# Patient Record
Sex: Male | Born: 1990 | Race: White | Hispanic: Yes | Marital: Single | Smoking: Never smoker
Health system: Southern US, Community
[De-identification: ages and names within clinical notes are randomized; demographics above are authoritative.]

## PROBLEM LIST (undated history)

## (undated) DIAGNOSIS — I10 Essential (primary) hypertension: Secondary | ICD-10-CM

## (undated) DIAGNOSIS — R569 Unspecified convulsions: Secondary | ICD-10-CM

---

## 2016-09-25 ENCOUNTER — Encounter (HOSPITAL_COMMUNITY): Payer: Self-pay | Admitting: Emergency Medicine

## 2016-09-25 ENCOUNTER — Emergency Department (HOSPITAL_COMMUNITY): Payer: Self-pay

## 2016-09-25 ENCOUNTER — Emergency Department (HOSPITAL_COMMUNITY)
Admission: EM | Admit: 2016-09-25 | Discharge: 2016-09-26 | Disposition: A | Discharge status: Home / Self Care | Payer: Self-pay | Attending: Emergency Medicine | Admitting: Emergency Medicine

## 2016-09-25 DIAGNOSIS — Y939 Activity, unspecified: Secondary | ICD-10-CM | POA: Insufficient documentation

## 2016-09-25 DIAGNOSIS — W228XXA Striking against or struck by other objects, initial encounter: Secondary | ICD-10-CM | POA: Insufficient documentation

## 2016-09-25 DIAGNOSIS — Z5181 Encounter for therapeutic drug level monitoring: Secondary | ICD-10-CM | POA: Insufficient documentation

## 2016-09-25 DIAGNOSIS — Y92009 Unspecified place in unspecified non-institutional (private) residence as the place of occurrence of the external cause: Secondary | ICD-10-CM | POA: Insufficient documentation

## 2016-09-25 DIAGNOSIS — Y999 Unspecified external cause status: Secondary | ICD-10-CM | POA: Insufficient documentation

## 2016-09-25 DIAGNOSIS — S0990XA Unspecified injury of head, initial encounter: Secondary | ICD-10-CM | POA: Insufficient documentation

## 2016-09-25 DIAGNOSIS — R55 Syncope and collapse: Secondary | ICD-10-CM | POA: Insufficient documentation

## 2016-09-25 LAB — CBG MONITORING, ED: GLUCOSE-CAPILLARY: 141 mg/dL — AB (ref 65–99)

## 2016-09-25 MED ORDER — SODIUM CHLORIDE 0.9 % IV BOLUS (SEPSIS)
1000.0000 mL | Freq: Once | INTRAVENOUS | Status: AC
  Administered 2016-09-25: 1000 mL via INTRAVENOUS

## 2016-09-25 MED ORDER — ONDANSETRON HCL 4 MG/2ML IJ SOLN
4.0000 mg | Freq: Once | INTRAMUSCULAR | Status: AC
  Administered 2016-09-25: 4 mg via INTRAVENOUS
  Filled 2016-09-25: qty 2

## 2016-09-25 NOTE — ED Triage Notes (Signed)
Pt arrives via EMS after two falls at home, witnessed by family, unsure if seizure activity occurred but family described posturing episode. Hx HTN. Pt nauseated at this time. Given 4MG  zofran by EMS. Pt had hematoma to posterior head and abrasion to anterior head near hairline.   CBG 100, hypertensive in the field.

## 2016-09-25 NOTE — ED Notes (Signed)
Prompted to provide urine specimen 

## 2016-09-25 NOTE — ED Provider Notes (Signed)
[facility]-EMERGENCY DEPT Provider Note   CSN: [id number] Arrival date & time: 09/25/16  2251  By signing my name below, I, Christy SartoriusAnastasia Kolousek, attest that this documentation has been prepared under the direction and in the presence of Linwood DibblesJon Tonea Leiphart, MD . Electronically Signed: Christy SartoriusAnastasia Kolousek, Scribe. 09/25/2016. 12:25 AM.  History   Chief Complaint Chief Complaint  Patient presents with  . Fall   The history is provided by the patient and medical records. No language interpreter was used.    HPI Comments:  Tyler Small is a 25 y.o. male brought in by ambulance, who presents to the Emergency Department s/p syncope just PTA complaining of pain in his head, upper back, neck and upper body.   Pt states that he blacked out a halfway house and fell hitting his neck and back onto the ground.  Pt does not remember the episode; he was put in a bed at the halfway house and the first thing he remembers is waking up in the bed hearing the voices of people in the room.  Pt denies history of syncope and feeling sick today.  Pt states he has no known medical problems and takes no regular medications, but used to take medication for HTN.  He denies  known allergies, smoking and FMHx of medical problems.  He also denies vomiting, diarrhea, fever and blood in stool.     History reviewed. No pertinent past medical history.  There are no active problems to display for this patient.   No past surgical history on file.     Home Medications    Prior to Admission medications   Medication Sig Start Date End Date Taking? Authorizing Provider  naproxen (NAPROSYN) 375 MG tablet Take 1 tablet (375 mg total) by mouth 2 (two) times daily. 09/26/16   Linwood DibblesJon Yancy Knoble, MD    Family History No family history on file.  Social History Social History  Substance Use Topics  . Smoking status: Never Smoker  . Smokeless tobacco: Never Used  . Alcohol use No     Allergies   Review of patient's allergies indicates no  known allergies.   Review of Systems Review of Systems  All other systems reviewed and are negative.    Physical Exam Updated Vital Signs BP 136/65 (BP Location: Right Arm)   Pulse (!) 59   Temp 99 F (37.2 C) (Oral)   Resp 19   Ht 5\' 5"  (1.651 m)   Wt 75.8 kg   SpO2 99%   BMI 27.79 kg/m   Physical Exam  Constitutional: He appears well-developed and well-nourished. No distress.  HENT:  Head: Normocephalic and atraumatic. Head is without raccoon's eyes and without Battle's sign.  Right Ear: External ear normal.  Left Ear: External ear normal.  Eyes: Lids are normal. Right eye exhibits no discharge. Right conjunctiva has no hemorrhage. Left conjunctiva has no hemorrhage.  Neck: No spinous process tenderness present. No tracheal deviation and no edema present.  Cardiovascular: Normal rate, regular rhythm and normal heart sounds.   Pulmonary/Chest: Effort normal and breath sounds normal. No stridor. No respiratory distress. He exhibits no tenderness, no crepitus and no deformity.  Abdominal: Soft. Normal appearance and bowel sounds are normal. He exhibits no distension and no mass. There is no tenderness.  Negative for seat belt sign  Musculoskeletal:       Cervical back: He exhibits tenderness. He exhibits no swelling and no deformity.       Thoracic back: He exhibits no tenderness, no  swelling and no deformity.       Lumbar back: He exhibits no tenderness and no swelling.  Pelvis stable, no ttp  Neurological: He is alert. He has normal strength. No sensory deficit. He exhibits normal muscle tone. GCS eye subscore is 4. GCS verbal subscore is 5. GCS motor subscore is 6.  Able to move all extremities, sensation intact throughout  Skin: He is not diaphoretic.  Psychiatric: He has a normal mood and affect. His speech is normal and behavior is normal.  Nursing note and vitals reviewed.    ED Treatments / Results   DIAGNOSTIC STUDIES:  Oxygen Saturation is 100% on RA, NML  by my interpretation.    COORDINATION OF CARE:  11:30 PM Discussed treatment plan with pt at bedside and pt agreed to plan.  Labs (all labs ordered are listed, but only abnormal results are displayed) Labs Reviewed  BASIC METABOLIC PANEL - Abnormal; Notable for the following:       Result Value   Glucose, Bld 135 (*)    All other components within normal limits  CBC - Abnormal; Notable for the following:    WBC 12.8 (*)    All other components within normal limits  CBG MONITORING, ED - Abnormal; Notable for the following:    Glucose-Capillary 141 (*)    All other components within normal limits  ETHANOL  RAPID URINE DRUG SCREEN, HOSP PERFORMED    EKG  EKG Interpretation  Date/Time:  Saturday September 25 2016 23:00:10 EDT Ventricular Rate:  61 PR Interval:    QRS Duration: 92 QT Interval:  424 QTC Calculation: 428 R Axis:   76 Text Interpretation:  Sinus rhythm LVH by voltage ST elev, probable normal early repol pattern No old tracing to compare Confirmed by Bryden Darden  MD-J, Karon Cotterill [phone]) on 09/25/2016 11:15:54 PM       Radiology Dg Chest 2 View  Result Date: 09/26/2016 CLINICAL DATA:  25 year old male with fall and back pain. EXAM: CHEST  2 VIEW COMPARISON:  None. FINDINGS: The lungs are clear. There is no pleural effusion or pneumothorax. The cardiac silhouette is within normal limits. There is no acute fracture or subluxation of the thoracic spine. The vertebral body heights and disc spaces are maintained. The visualized posterior elements appear intact. IMPRESSION: No active cardiopulmonary disease. No acute/traumatic thoracic spine pathology. Electronically Signed   By: Elgie Collard M.D.   On: 09/26/2016 00:49   Dg Thoracic Spine 2 View  Result Date: 09/26/2016 CLINICAL DATA:  25 year old male with fall and back pain. EXAM: CHEST  2 VIEW COMPARISON:  None. FINDINGS: The lungs are clear. There is no pleural effusion or pneumothorax. The cardiac silhouette is within  normal limits. There is no acute fracture or subluxation of the thoracic spine. The vertebral body heights and disc spaces are maintained. The visualized posterior elements appear intact. IMPRESSION: No active cardiopulmonary disease. No acute/traumatic thoracic spine pathology. Electronically Signed   By: Elgie Collard M.D.   On: 09/26/2016 00:49   Ct Head Wo Contrast  Result Date: 09/26/2016 CLINICAL DATA:  Syncope, fall and pain.  History of hypertension. EXAM: CT HEAD WITHOUT CONTRAST CT CERVICAL SPINE WITHOUT CONTRAST TECHNIQUE: Multidetector CT imaging of the head and cervical spine was performed following the standard protocol without intravenous contrast. Multiplanar CT image reconstructions of the cervical spine were also generated. COMPARISON:  None. FINDINGS: CT HEAD FINDINGS BRAIN: The ventricles and sulci are normal. No intraparenchymal hemorrhage, mass effect nor midline shift. No acute  large vascular territory infarcts. No abnormal extra-axial fluid collections. Basal cisterns are patent. VASCULAR: Unremarkable. SKULL/SOFT TISSUES: No skull fracture. No significant soft tissue swelling. ORBITS/SINUSES: The included ocular globes and orbital contents are normal.Minimal paranasal sinus mucosal thickening. Mastoid air cells are well aerated. OTHER: None. CT CERVICAL SPINE FINDINGS ALIGNMENT: Vertebral bodies in alignment.  Straightened lordosis. SKULL BASE AND VERTEBRAE: Cervical vertebral bodies and posterior elements are intact. Intervertebral disc heights preserved. No destructive bony lesions. C1-2 articulation maintained. SOFT TISSUES AND SPINAL CANAL: Normal. DISC LEVELS: No significant osseous canal stenosis or neural foraminal narrowing. UPPER CHEST: Lung apices are clear. OTHER: None. IMPRESSION: Normal CT HEAD. Normal CT CERVICAL SPINE. Electronically Signed   By: Awilda Metro M.D.   On: 09/26/2016 02:14   Ct Cervical Spine Wo Contrast  Result Date: 09/26/2016 CLINICAL DATA:   Syncope, fall and pain.  History of hypertension. EXAM: CT HEAD WITHOUT CONTRAST CT CERVICAL SPINE WITHOUT CONTRAST TECHNIQUE: Multidetector CT imaging of the head and cervical spine was performed following the standard protocol without intravenous contrast. Multiplanar CT image reconstructions of the cervical spine were also generated. COMPARISON:  None. FINDINGS: CT HEAD FINDINGS BRAIN: The ventricles and sulci are normal. No intraparenchymal hemorrhage, mass effect nor midline shift. No acute large vascular territory infarcts. No abnormal extra-axial fluid collections. Basal cisterns are patent. VASCULAR: Unremarkable. SKULL/SOFT TISSUES: No skull fracture. No significant soft tissue swelling. ORBITS/SINUSES: The included ocular globes and orbital contents are normal.Minimal paranasal sinus mucosal thickening. Mastoid air cells are well aerated. OTHER: None. CT CERVICAL SPINE FINDINGS ALIGNMENT: Vertebral bodies in alignment.  Straightened lordosis. SKULL BASE AND VERTEBRAE: Cervical vertebral bodies and posterior elements are intact. Intervertebral disc heights preserved. No destructive bony lesions. C1-2 articulation maintained. SOFT TISSUES AND SPINAL CANAL: Normal. DISC LEVELS: No significant osseous canal stenosis or neural foraminal narrowing. UPPER CHEST: Lung apices are clear. OTHER: None. IMPRESSION: Normal CT HEAD. Normal CT CERVICAL SPINE. Electronically Signed   By: Awilda Metro M.D.   On: 09/26/2016 02:14    Procedures Procedures (including critical care time)  Medications Ordered in ED Medications  ondansetron [facility]) injection 4 mg (4 mg Intravenous Given 09/25/16 2306)  sodium chloride 0.9 % bolus 1,000 mL (0 mLs Intravenous Stopped 09/26/16 0330)     Initial Impression / Assessment and Plan / ED Course  I have reviewed the triage vital signs and the nursing notes.  Pertinent labs & imaging results that were available during my care of the patient were reviewed by me and  considered in my medical decision making (see chart for details).  Clinical Course  Unclear etiology for the syncope but no concerning findings on exam or during the evaluation.  Doubt cardiac event.  Normal neuro exam.  Doubt stroke, tia, hemorrhage.  No evidence of serious injury associated with the fall.  Consistent with soft tissue injury/strain.  Explained findings to patient and warning signs that should prompt return to the ED.   Final Clinical Impressions(s) / ED Diagnoses   Final diagnoses:  Syncope, unspecified syncope type  Injury of head, initial encounter    New Prescriptions New Prescriptions   NAPROXEN (NAPROSYN) 375 MG TABLET    Take 1 tablet (375 mg total) by mouth 2 (two) times daily.   I personally performed the services described in this documentation, which was scribed in my presence.  The recorded information has been reviewed and is accurate.    Linwood Dibbles, MD 09/26/16 [phone]

## 2016-09-26 LAB — CBC
HEMATOCRIT: 41.6 % (ref 39.0–52.0)
Hemoglobin: 14.1 g/dL (ref 13.0–17.0)
MCH: 29.5 pg (ref 26.0–34.0)
MCHC: 33.9 g/dL (ref 30.0–36.0)
MCV: 87 fL (ref 78.0–100.0)
PLATELETS: 259 10*3/uL (ref 150–400)
RBC: 4.78 MIL/uL (ref 4.22–5.81)
RDW: 11.7 % (ref 11.5–15.5)
WBC: 12.8 10*3/uL — ABNORMAL HIGH (ref 4.0–10.5)

## 2016-09-26 LAB — BASIC METABOLIC PANEL
Anion gap: 8 (ref 5–15)
BUN: 9 mg/dL (ref 6–20)
CO2: 25 mmol/L (ref 22–32)
CREATININE: 0.97 mg/dL (ref 0.61–1.24)
Calcium: 9 mg/dL (ref 8.9–10.3)
Chloride: 104 mmol/L (ref 101–111)
GFR calc Af Amer: >60 mL/min (ref >60)
GLUCOSE: 135 mg/dL — AB (ref 65–99)
POTASSIUM: 4.8 mmol/L (ref 3.5–5.1)
Sodium: 137 mmol/L (ref 135–145)

## 2016-09-26 LAB — RAPID URINE DRUG SCREEN, HOSP PERFORMED
Amphetamines: NOT DETECTED
BARBITURATES: NOT DETECTED
Benzodiazepines: NOT DETECTED
Cocaine: NOT DETECTED
OPIATES: NOT DETECTED
TETRAHYDROCANNABINOL: NOT DETECTED

## 2016-09-26 LAB — ETHANOL: Alcohol, Ethyl (B): <5 mg/dL (ref <5)

## 2016-09-26 MED ORDER — NAPROXEN 375 MG PO TABS: 375.0000 mg | ORAL_TABLET | Freq: Two times a day (BID) | ORAL | 0 refills | Status: AC

## 2016-09-26 NOTE — ED Notes (Signed)
Patient left at this time with all belongings. 

## 2016-09-26 NOTE — ED Notes (Signed)
Prompted to provide urine sample again

## 2018-04-30 IMAGING — DX DG CHEST 2V
2 series · 2 of 2 positions shown · non-contrast
Comparison: None.

CLINICAL DATA: 24-year-old male with fall and back pain.

EXAM:
CHEST  2 VIEW

[chest lat]
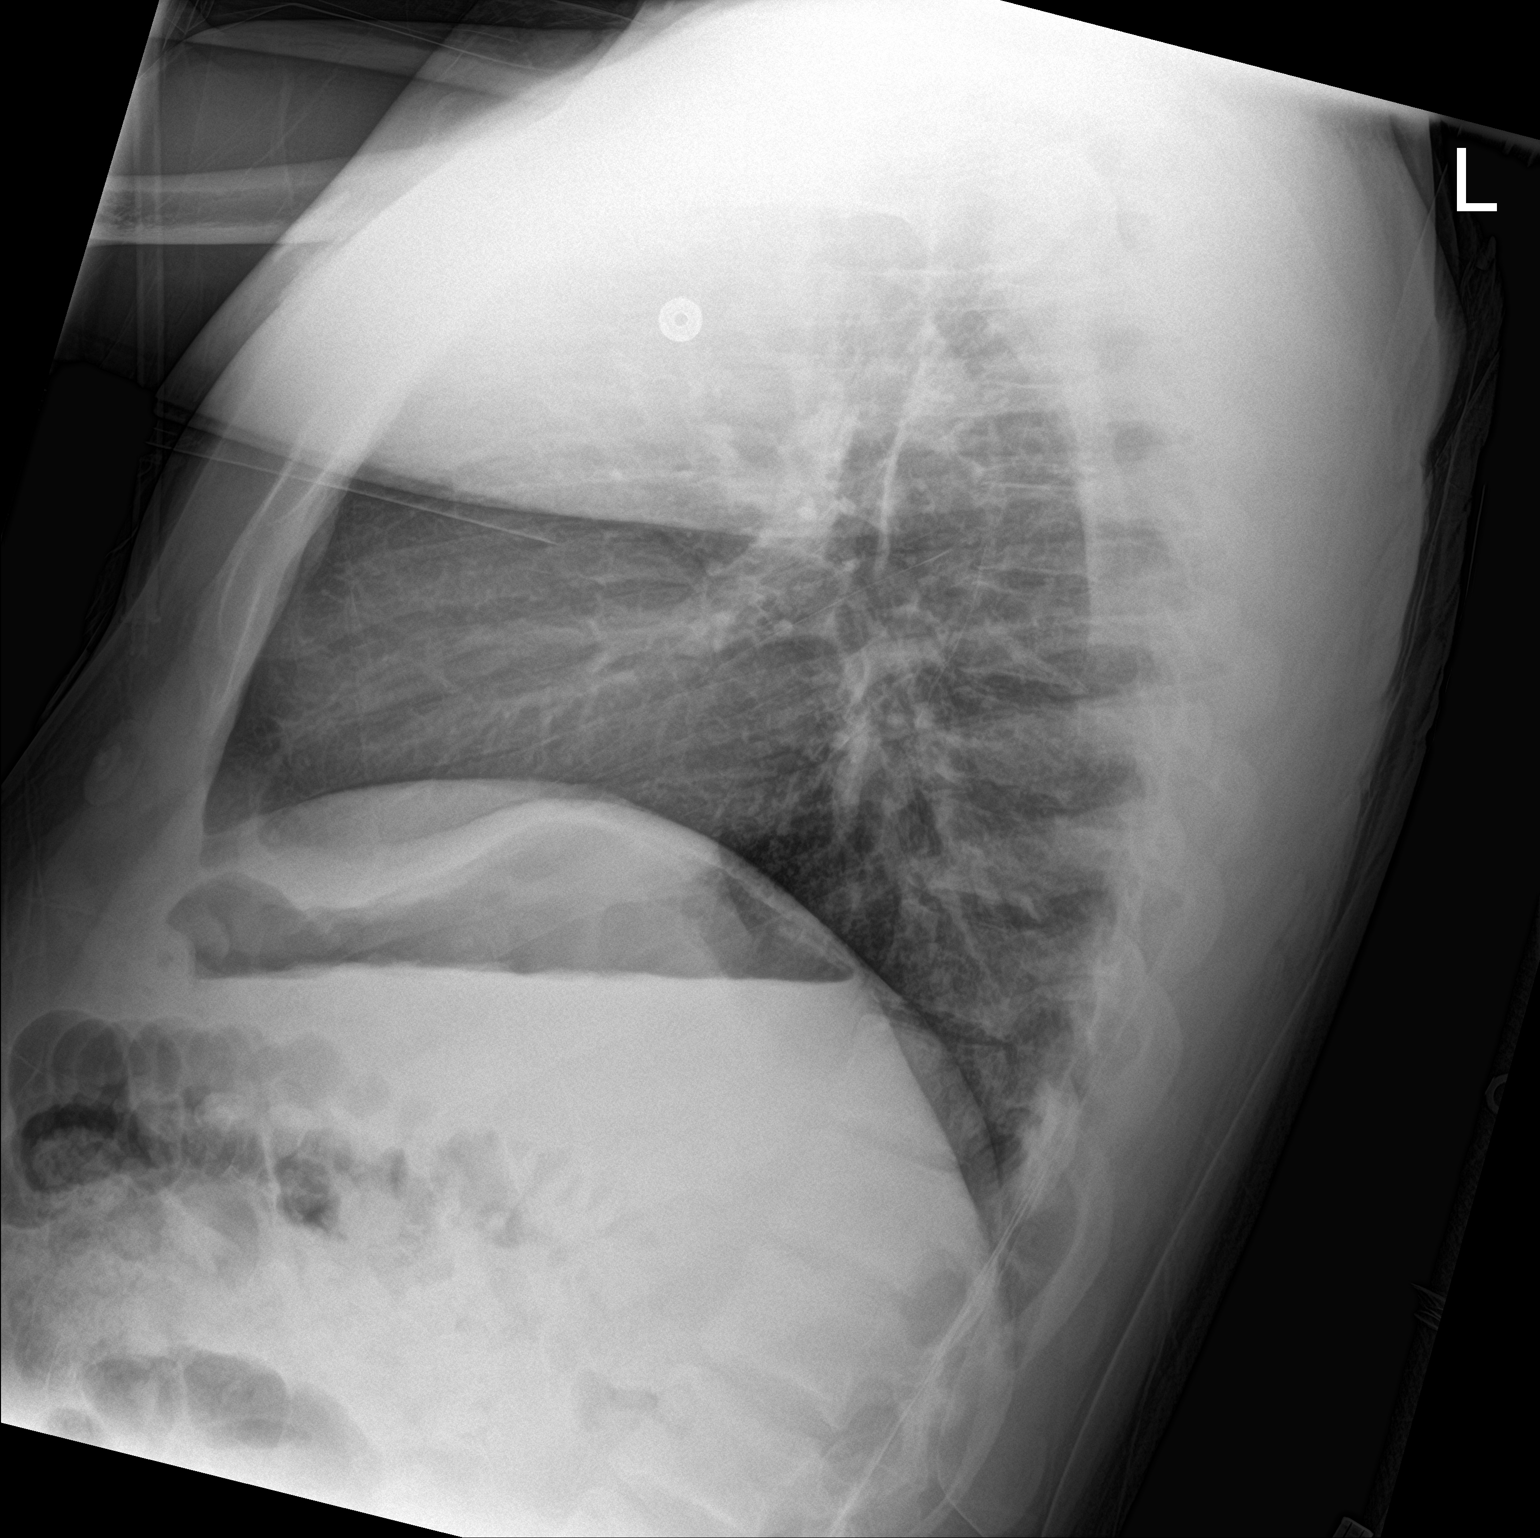

[chest ap]
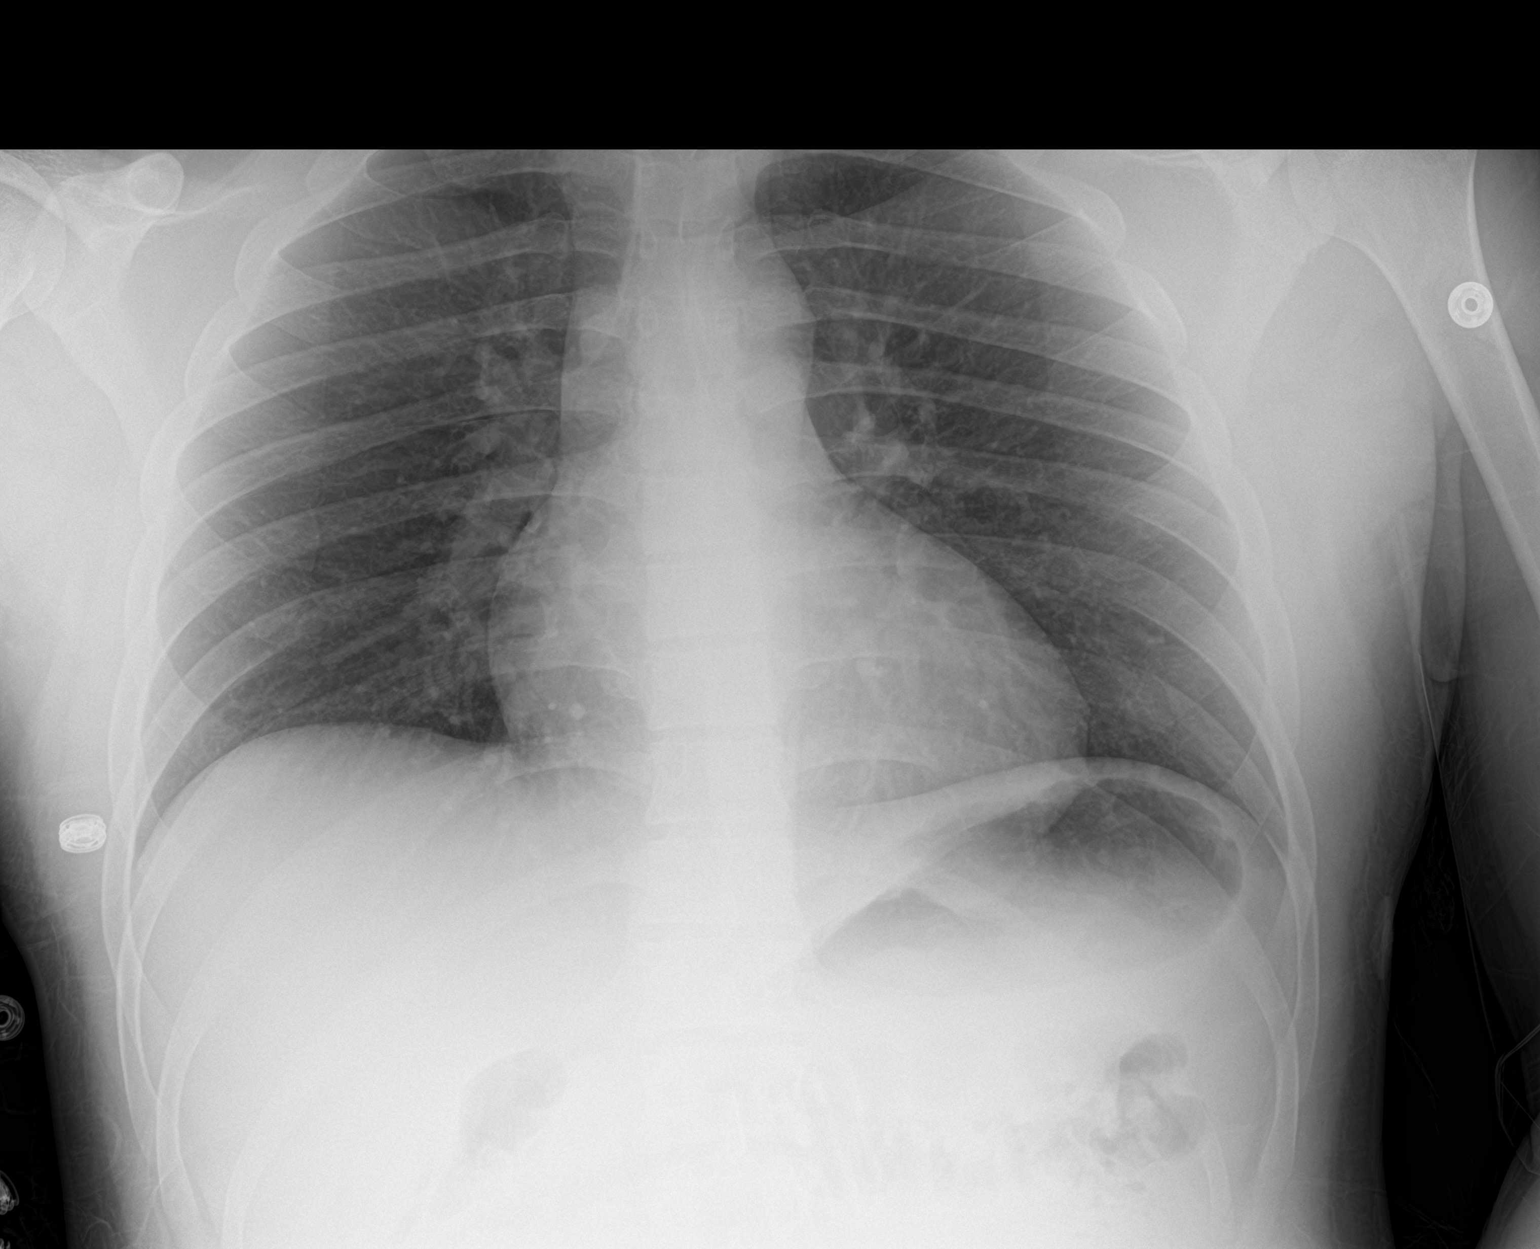

[2 of 2 positions shown; findings below may reference images not displayed]

FINDINGS: The lungs are clear. There is no pleural effusion or pneumothorax.
The cardiac silhouette is within normal limits.

There is no acute fracture or subluxation of the thoracic spine. The
vertebral body heights and disc spaces are maintained. The
visualized posterior elements appear intact.
IMPRESSION: No active cardiopulmonary disease.

No acute/traumatic thoracic spine pathology.

## 2018-04-30 IMAGING — CT CT HEAD W/O CM
4 of 8 series · 17 of 47 positions shown, 19 images · non-contrast
Comparison: None.

CLINICAL DATA: Syncope, fall and pain.  History of hypertension.

EXAM:
CT HEAD WITHOUT CONTRAST
CT CERVICAL SPINE WITHOUT CONTRAST
TECHNIQUE: Multidetector CT imaging of the head and cervical spine was
performed following the standard protocol without intravenous
contrast. Multiplanar CT image reconstructions of the cervical spine
were also generated.

[Series 4: head 2.0 h70h · axial · 0.41mm/px · z∈[+1268,+1394]mm · 7 of 85 slices shown, 9 images]
[im 11/85  brain]
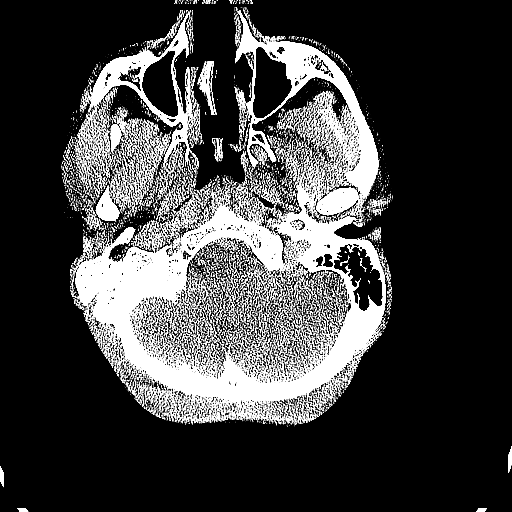
[im 11/85  bone]
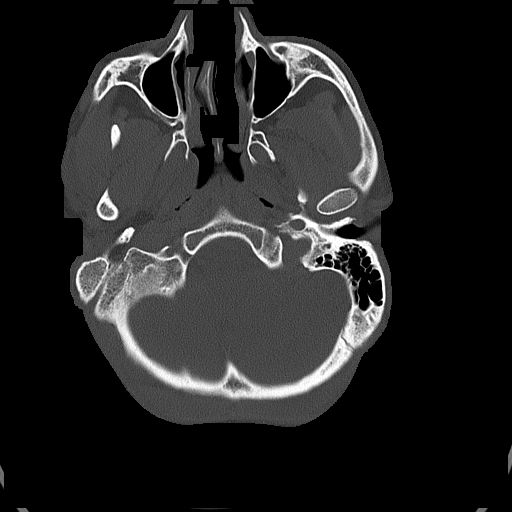
[im 22/85  brain]
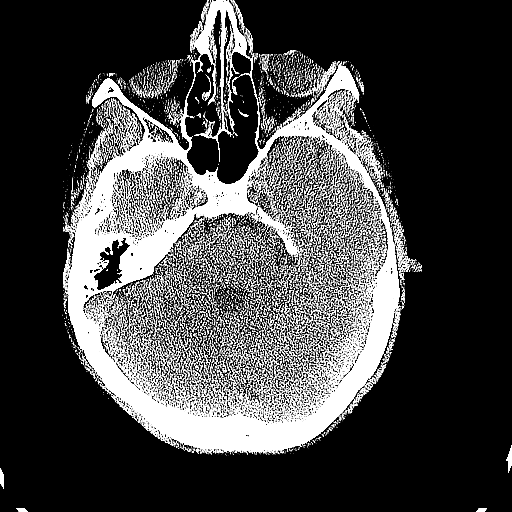
[im 32/85  brain]
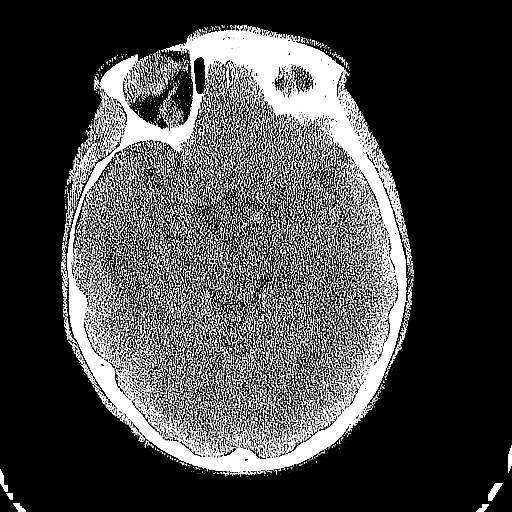
[im 43/85  brain]
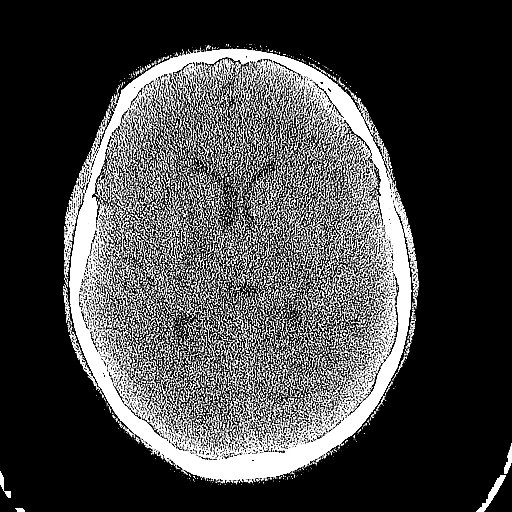
[im 53/85  brain]
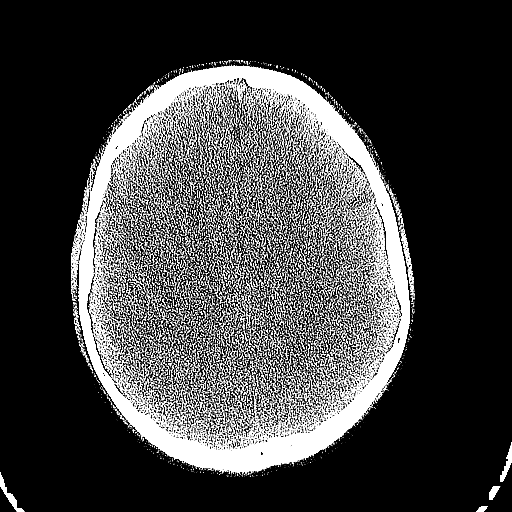
[im 53/85  bone]
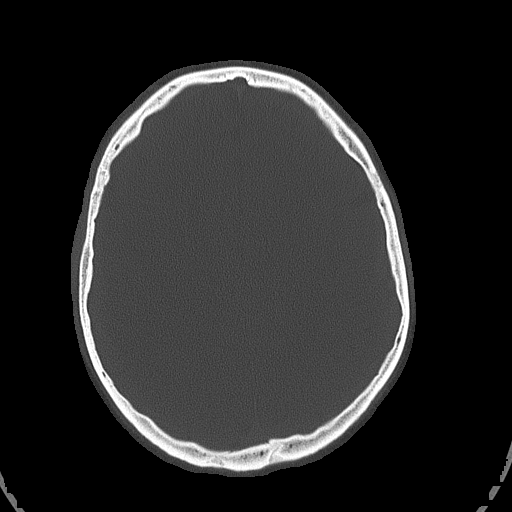
[im 64/85  brain]
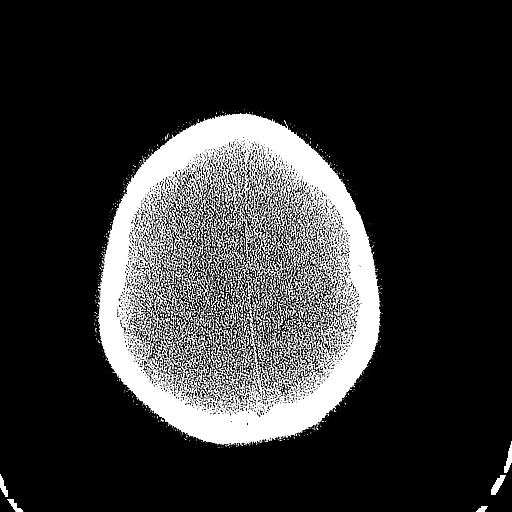
[im 74/85  brain]
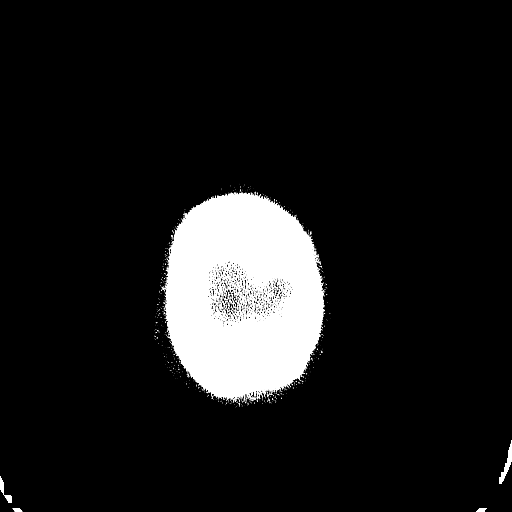

[Series 5: head 3.0 mpr · coronal · 0.33mm/px · 3 of 67 slices shown (1 of 2)]
[im 17/67  brain]
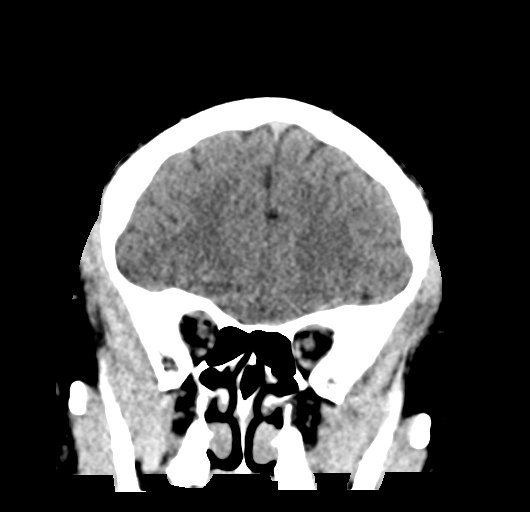
[im 34/67  brain]
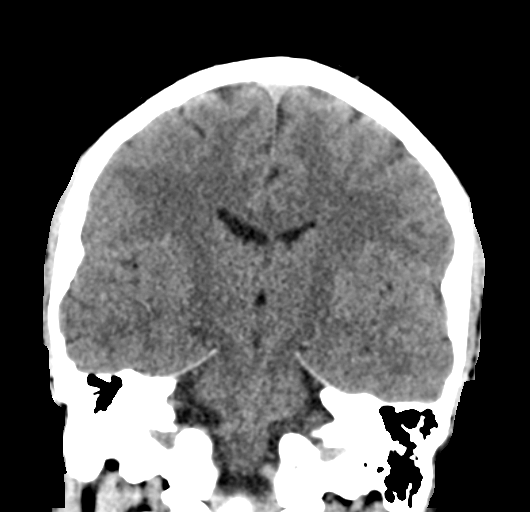
[im 50/67  brain]
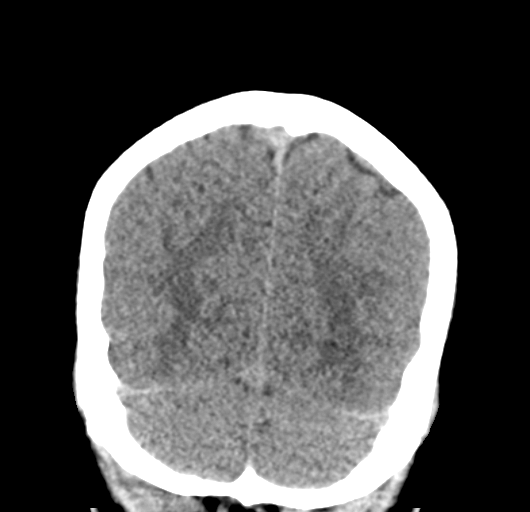

[Series 6: head 3.0 mpr · sagittal · 0.35mm/px · 2 of 65 slices shown (2 of 2)]
[im 22/65  brain]
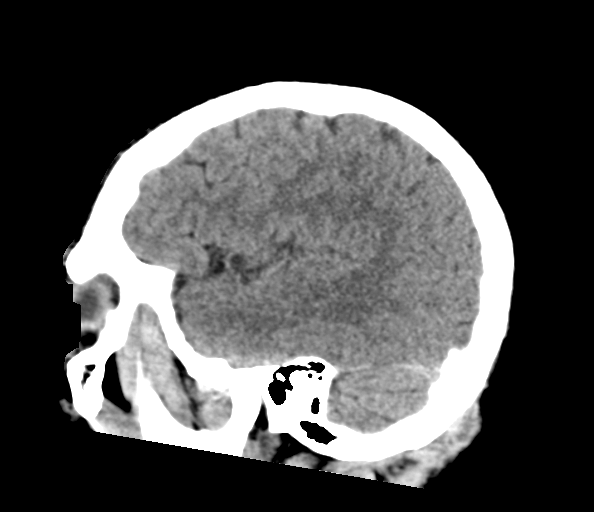
[im 43/65  brain]
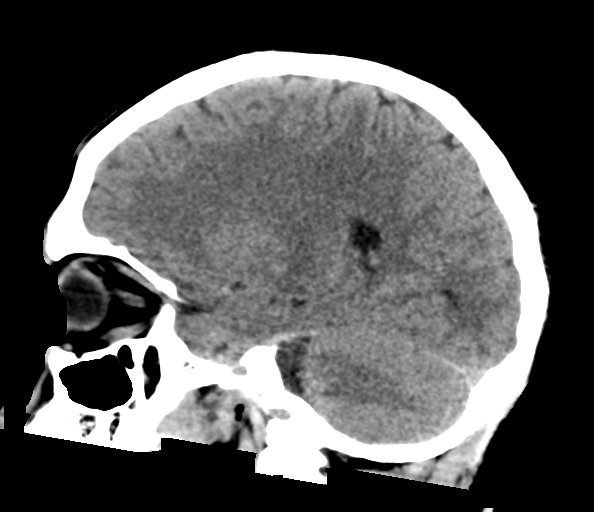

[Series 12: orthogonals · axial · 0.23mm/px · z∈[+1122,+1204]mm · 5 of 73 slices shown]
[im 11/73  brain]
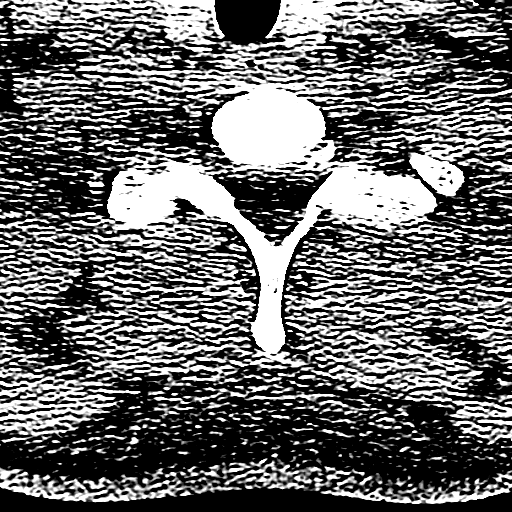
[im 21/73  brain]
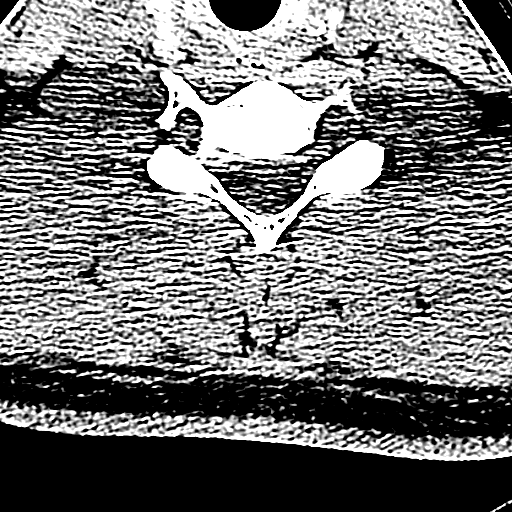
[im 31/73  brain]
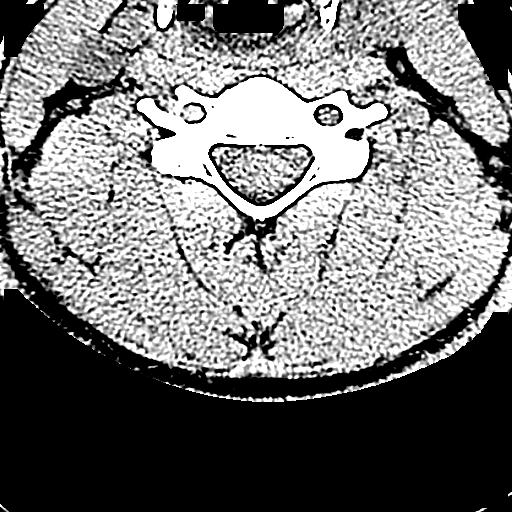
[im 42/73  brain]
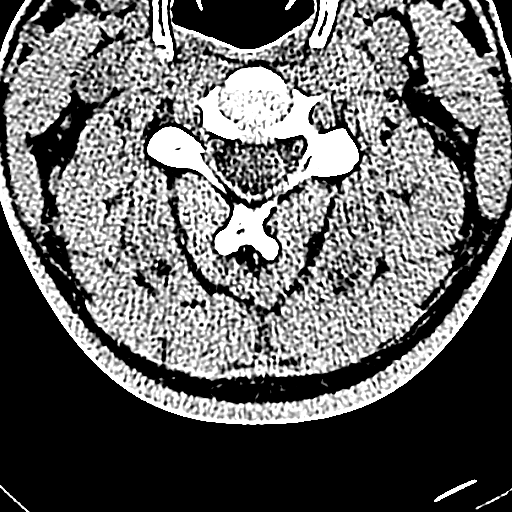
[im 52/73  brain]
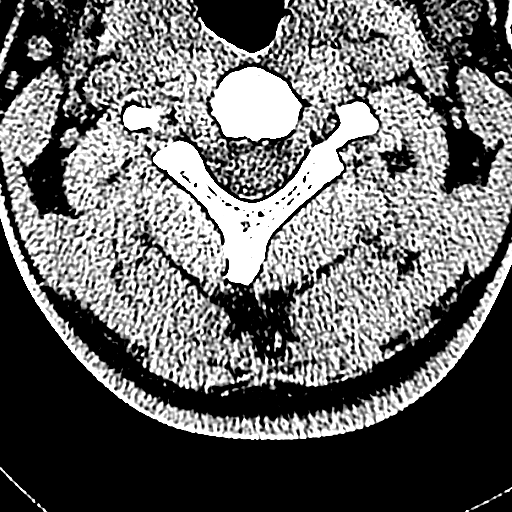

[17 of 47 positions shown; findings below may reference images not displayed]

FINDINGS: CT HEAD FINDINGS

BRAIN: The ventricles and sulci are normal. No intraparenchymal
hemorrhage, mass effect nor midline shift. No acute large vascular
territory infarcts. No abnormal extra-axial fluid collections. Basal
cisterns are patent.

VASCULAR: Unremarkable.

SKULL/SOFT TISSUES: No skull fracture. No significant soft tissue
swelling.

ORBITS/SINUSES: The included ocular globes and orbital contents are
normal.Minimal paranasal sinus mucosal thickening. Mastoid air cells
are well aerated.

OTHER: None.

CT CERVICAL SPINE FINDINGS

ALIGNMENT: Vertebral bodies in alignment.  Straightened lordosis.

SKULL BASE AND VERTEBRAE: Cervical vertebral bodies and posterior
elements are intact. Intervertebral disc heights preserved. No
destructive bony lesions. C1-2 articulation maintained.

SOFT TISSUES AND SPINAL CANAL: Normal.

DISC LEVELS: No significant osseous canal stenosis or neural
foraminal narrowing.

UPPER CHEST: Lung apices are clear.

OTHER: None.
IMPRESSION: Normal CT HEAD.

Normal CT CERVICAL SPINE.

## 2023-01-12 ENCOUNTER — Other Ambulatory Visit: Payer: Self-pay

## 2023-01-12 ENCOUNTER — Encounter (HOSPITAL_BASED_OUTPATIENT_CLINIC_OR_DEPARTMENT_OTHER): Payer: Self-pay | Admitting: Emergency Medicine

## 2023-01-12 ENCOUNTER — Other Ambulatory Visit (HOSPITAL_BASED_OUTPATIENT_CLINIC_OR_DEPARTMENT_OTHER): Payer: Self-pay

## 2023-01-12 ENCOUNTER — Emergency Department (HOSPITAL_BASED_OUTPATIENT_CLINIC_OR_DEPARTMENT_OTHER)
Admission: EM | Admit: 2023-01-12 | Discharge: 2023-01-12 | Disposition: A | Discharge status: Home / Self Care | Payer: Medicaid Other | Attending: Emergency Medicine | Admitting: Emergency Medicine

## 2023-01-12 DIAGNOSIS — M25571 Pain in right ankle and joints of right foot: Secondary | ICD-10-CM | POA: Insufficient documentation

## 2023-01-12 DIAGNOSIS — R112 Nausea with vomiting, unspecified: Secondary | ICD-10-CM | POA: Insufficient documentation

## 2023-01-12 DIAGNOSIS — M79604 Pain in right leg: Secondary | ICD-10-CM

## 2023-01-12 HISTORY — DX: Essential (primary) hypertension: I10

## 2023-01-12 LAB — COMPREHENSIVE METABOLIC PANEL
ALT: 62 U/L — ABNORMAL HIGH (ref 0–44)
AST: 25 U/L (ref 15–41)
Albumin: 4.6 g/dL (ref 3.5–5.0)
Alkaline Phosphatase: 63 U/L (ref 38–126)
Anion gap: 7 (ref 5–15)
BUN: 12 mg/dL (ref 6–20)
CO2: 27 mmol/L (ref 22–32)
Calcium: 9.7 mg/dL (ref 8.9–10.3)
Chloride: 104 mmol/L (ref 98–111)
Creatinine, Ser: 1.03 mg/dL (ref 0.61–1.24)
GFR, Estimated: >60 mL/min (ref >60)
Glucose, Bld: 117 mg/dL — ABNORMAL HIGH (ref 70–99)
Potassium: 4.1 mmol/L (ref 3.5–5.1)
Sodium: 138 mmol/L (ref 135–145)
Total Bilirubin: 0.4 mg/dL (ref 0.3–1.2)
Total Protein: 7.5 g/dL (ref 6.5–8.1)

## 2023-01-12 LAB — CBC WITH DIFFERENTIAL/PLATELET
Abs Immature Granulocytes: 0.05 10*3/uL (ref 0.00–0.07)
Basophils Absolute: 0 10*3/uL (ref 0.0–0.1)
Basophils Relative: 0 %
Eosinophils Absolute: 0.1 10*3/uL (ref 0.0–0.5)
Eosinophils Relative: 2 %
HCT: 44.6 % (ref 39.0–52.0)
Hemoglobin: 15.7 g/dL (ref 13.0–17.0)
Immature Granulocytes: 1 %
Lymphocytes Relative: 38 %
Lymphs Abs: 3.4 10*3/uL (ref 0.7–4.0)
MCH: 29.5 pg (ref 26.0–34.0)
MCHC: 35.2 g/dL (ref 30.0–36.0)
MCV: 83.8 fL (ref 80.0–100.0)
Monocytes Absolute: 0.6 10*3/uL (ref 0.1–1.0)
Monocytes Relative: 6 %
Neutro Abs: 4.9 10*3/uL (ref 1.7–7.7)
Neutrophils Relative %: 53 %
Platelets: 308 10*3/uL (ref 150–400)
RBC: 5.32 MIL/uL (ref 4.22–5.81)
RDW: 11.4 % — ABNORMAL LOW (ref 11.5–15.5)
WBC: 9 10*3/uL (ref 4.0–10.5)
nRBC: 0 % (ref 0.0–0.2)

## 2023-01-12 LAB — LIPASE, BLOOD: Lipase: 18 U/L (ref 11–51)

## 2023-01-12 MED ORDER — PANTOPRAZOLE SODIUM 40 MG PO TBEC: 40.0000 mg | DELAYED_RELEASE_TABLET | Freq: Every day | ORAL | 0 refills | Status: AC

## 2023-01-12 MED ORDER — ONDANSETRON HCL 4 MG PO TABS: 4.0000 mg | ORAL_TABLET | Freq: Four times a day (QID) | ORAL | 0 refills | Status: AC

## 2023-01-12 MED ORDER — ONDANSETRON HCL 4 MG PO TABS
4.0000 mg | ORAL_TABLET | Freq: Four times a day (QID) | ORAL | 0 refills | Status: DC
  Filled 2023-01-12: qty 12, 3d supply, fill #0

## 2023-01-12 MED ORDER — PANTOPRAZOLE SODIUM 40 MG PO TBEC
40.0000 mg | DELAYED_RELEASE_TABLET | Freq: Every day | ORAL | 0 refills | Status: DC
  Filled 2023-01-12: qty 30, 30d supply, fill #0

## 2023-01-12 NOTE — ED Notes (Signed)
Patient Alert and oriented to baseline. Stable and ambulatory to baseline. Patient verbalized understanding of the discharge instructions.  Patient belongings were taken by the patient.   

## 2023-01-12 NOTE — ED Provider Notes (Signed)
[facility] Provider Note   CSN: [id number] Arrival date & time: 01/12/23  1058     History  Chief Complaint  Patient presents with   Leg Pain    Tyler Small is a 32 y.o. male.  Presents today complaining of right anterior lateral ankle pain that started yesterday.  He states he was walking on a roof while working and it started hurting.  When she walks there is no pain but states otherwise it is sharp and shoots from his ankle up his leg.  He denies any injury or trauma.  This happened about 2 months ago and he had x-rays that were normal and the pain is exactly the same.  He did not follow-up with anybody.  No numbness tingling or weakness. He is here with his wife and also states that they are concerned because several months ago he had nausea and vomiting was seen in the ED was felt was likely a stomach virus and outside hospital but this did not feel normalize having early satiety, lack of appetite and intermittent vomiting since that time.  He states last week he started vomiting and vomited several times and had some blood-streaked emesis at the end.  Denies any black or bloody stools.  No fevers or chills.   Leg Pain      Home Medications Prior to Admission medications   Medication Sig Start Date End Date Taking? Authorizing Provider  naproxen (NAPROSYN) 375 MG tablet Take 1 tablet (375 mg total) by mouth 2 (two) times daily. 09/26/16   Dorie Rank, MD      Allergies    Patient has no known allergies.    Review of Systems   Review of Systems  Physical Exam Updated Vital Signs BP (!) 143/86 (BP Location: Right Arm)   Pulse 69   Temp 98.8 F (37.1 C)   Resp 15   Ht 5\' 5"  (1.651 m)   Wt 99.8 kg   SpO2 99%   BMI 36.61 kg/m  Physical Exam  ED Results / Procedures / Treatments   Labs (all labs ordered are listed, but only abnormal results are displayed) Labs Reviewed  CBC WITH DIFFERENTIAL/PLATELET - Abnormal; Notable  for the following components:      Result Value   RDW 11.4 (*)    All other components within normal limits  COMPREHENSIVE METABOLIC PANEL - Abnormal; Notable for the following components:   Glucose, Bld 117 (*)    ALT 62 (*)    All other components within normal limits  LIPASE, BLOOD    EKG None  Radiology No results found.  Procedures Procedures    Medications Ordered in ED Medications - No data to display  ED Course/ Medical Decision Making/ A&P                             Medical Decision Making This patient presents to the ED for concern of right anterior leg pain, this involves an extensive number of treatment options, and is a complaint that carries with it a high risk of complications and morbidity.  The differential diagnosis includes fracture, sprain, strain, contusion, DVT, other     Additional history obtained:  Additional history obtained from EMR External records from outside source obtained and reviewed including prior ED visit and prior x-rays in ED for right ankle pain in the same area       Problem List /  ED Course / Critical interventions / Medication management  Ankle pain-patient has pain area of the right ATF ligament.  He has no bony tenderness, [place] ankle rules are negative.  He is able to bear weight and ablate.  He already had x-rays for the same problem do not feel repeat x-ray would be helpful and patient is agreeable with this.  Patient has no calf swelling or tenderness, have no concern for DVT.  He has excellent pulses and DP and PT arteries of his foot.  Discussed need for follow-up with orthopedics.  On his last visit he was given a walking boot and Ortho follow-up.  He states that he did not follow-up he did not have insurance but he did find out today that he was approved for Medicaid and will be able to follow-up.  They requested information for follow-up in [facility] which was given to him.  He is given an Aircast  today to help support his ankle and was able to ambulate.  He also has been complaining of nausea and vomiting this been going on for weeks.  It started with pain he was seen in another ED and states he had labs and a CT that were all reassuring.  He states that he still having nausea and vomiting intermittently and had 1 episode of hematemesis a week ago.  That is not the reason he came to the ED but would like labs for further evaluation.  He has reassuring labs except for mildly elevated ALT.  He does not drink alcohol, does not excessively use NSAIDs.  I discussed with him we will start him on a PPI and he was referred for GI follow-up which she is agreeable with as well.     Test / Admission - Considered:  CT abdomen pelvis and indeed discussed with the patient.  He states he had a CT abdomen pelvis at an outlying facility recently and that showed nothing and the symptoms have not changed and he does not want to do CT today    Amount and/or Complexity of Data Reviewed Labs: ordered.  Risk Prescription drug management.           Final Clinical Impression(s) / ED Diagnoses Final diagnoses:  None    Rx / DC Orders ED Discharge Orders     None         Darci Current 01/12/23 1430    Ezequiel Essex, MD 01/12/23 1712

## 2023-01-12 NOTE — Discharge Instructions (Addendum)
Today for right ankle pain.  You are given a boot, you recently had x-rays that were normal.  Rest the area, follow-up with orthopedics as listed.  You also need to follow-up with the GI doctor for your chronic nausea.  We are starting you on antacid medicine to take daily.  Please come back to the ER if you have new or worsening symptoms such as abdominal pain, vomiting, fevers, black or tarry stool or any other worrisome changes.

## 2023-01-12 NOTE — ED Triage Notes (Signed)
Pt here for right ankle/lower leg pain . Pt states he was seen about 2 months ago, nothing showed up in the xrays. Went away on its own but now returns.\\\  Pt also endorses nausea and vomiting and abdominal pain , states feels full when hasn't eaten. Vomited blood last Wednesday.

## 2023-01-12 NOTE — ED Triage Notes (Signed)
Pt states he always has a burning sensation in his leg

## 2023-01-12 NOTE — ED Notes (Signed)
Pt currently Tyler Small N/V. However states that root foot and ankle constantly burn, pain gets worse when walking or stadning

## 2023-03-16 ENCOUNTER — Emergency Department (HOSPITAL_BASED_OUTPATIENT_CLINIC_OR_DEPARTMENT_OTHER)
Admission: EM | Admit: 2023-03-16 | Discharge: 2023-03-17 | Disposition: A | Discharge status: Home / Self Care | Payer: Medicaid Other | Attending: Emergency Medicine | Admitting: Emergency Medicine

## 2023-03-16 ENCOUNTER — Other Ambulatory Visit: Payer: Self-pay

## 2023-03-16 ENCOUNTER — Encounter (HOSPITAL_BASED_OUTPATIENT_CLINIC_OR_DEPARTMENT_OTHER): Payer: Self-pay

## 2023-03-16 DIAGNOSIS — D72829 Elevated white blood cell count, unspecified: Secondary | ICD-10-CM | POA: Diagnosis not present

## 2023-03-16 DIAGNOSIS — R1032 Left lower quadrant pain: Secondary | ICD-10-CM | POA: Diagnosis present

## 2023-03-16 HISTORY — DX: Unspecified convulsions: R56.9

## 2023-03-16 LAB — URINALYSIS, ROUTINE W REFLEX MICROSCOPIC
Bacteria, UA: NONE SEEN
Bilirubin Urine: NEGATIVE
Glucose, UA: NEGATIVE mg/dL
Ketones, ur: NEGATIVE mg/dL
Leukocytes,Ua: NEGATIVE
Nitrite: NEGATIVE
Specific Gravity, Urine: 1.033 — ABNORMAL HIGH (ref 1.005–1.030)
pH: 5.5 (ref 5.0–8.0)

## 2023-03-16 LAB — COMPREHENSIVE METABOLIC PANEL
ALT: 46 U/L — ABNORMAL HIGH (ref 0–44)
AST: 18 U/L (ref 15–41)
Albumin: 4.6 g/dL (ref 3.5–5.0)
Alkaline Phosphatase: 62 U/L (ref 38–126)
Anion gap: 10 (ref 5–15)
BUN: 13 mg/dL (ref 6–20)
CO2: 24 mmol/L (ref 22–32)
Calcium: 9.6 mg/dL (ref 8.9–10.3)
Chloride: 106 mmol/L (ref 98–111)
Creatinine, Ser: 0.89 mg/dL (ref 0.61–1.24)
GFR, Estimated: >60 mL/min (ref >60)
Glucose, Bld: 127 mg/dL — ABNORMAL HIGH (ref 70–99)
Potassium: 3.7 mmol/L (ref 3.5–5.1)
Sodium: 140 mmol/L (ref 135–145)
Total Bilirubin: 0.5 mg/dL (ref 0.3–1.2)
Total Protein: 7 g/dL (ref 6.5–8.1)

## 2023-03-16 LAB — CBC
HCT: 42.5 % (ref 39.0–52.0)
Hemoglobin: 14.9 g/dL (ref 13.0–17.0)
MCH: 29.7 pg (ref 26.0–34.0)
MCHC: 35.1 g/dL (ref 30.0–36.0)
MCV: 84.8 fL (ref 80.0–100.0)
Platelets: 297 10*3/uL (ref 150–400)
RBC: 5.01 MIL/uL (ref 4.22–5.81)
RDW: 11.5 % (ref 11.5–15.5)
WBC: 11.6 10*3/uL — ABNORMAL HIGH (ref 4.0–10.5)
nRBC: 0 % (ref 0.0–0.2)

## 2023-03-16 LAB — LIPASE, BLOOD: Lipase: 13 U/L (ref 11–51)

## 2023-03-16 NOTE — ED Provider Notes (Signed)
[place] EMERGENCY DEPARTMENT AT [facility]  Provider Note  CSN: [id number] Arrival date & time: 03/16/23 2055  History Chief Complaint  Patient presents with   Abdominal Pain    Tyler Small is a 32 y.o. male with history of HTN and seizures reports 4-5 days of LLQ abdominal pain, no associated fever, nausea or vomiting. He felt like he was constipated and took some milk of magnesia with some stool output but no relief of pain. No prior history of surgeries on abdomen.    Home Medications Prior to Admission medications   Medication Sig Start Date End Date Taking? Authorizing Provider  naproxen (NAPROSYN) 375 MG tablet Take 1 tablet (375 mg total) by mouth 2 (two) times daily. 09/26/16   Linwood Dibbles, MD  ondansetron (ZOFRAN) 4 MG tablet Take 1 tablet (4 mg total) by mouth every 6 (six) hours. 01/12/23   Carmel Sacramento A, PA-C  pantoprazole (PROTONIX) 40 MG tablet Take 1 tablet (40 mg total) by mouth daily. 01/12/23   Carmel Sacramento A, PA-C     Allergies    Patient has no known allergies.   Review of Systems   Review of Systems Please see HPI for pertinent positives and negatives  Physical Exam BP (!) 145/100 (BP Location: Right Arm)   Pulse 70   Temp 98 F (36.7 C) (Oral)   Resp 18   Ht 5\' 4"  (1.626 m)   Wt 97.5 kg   SpO2 96%   BMI 36.90 kg/m   Physical Exam Vitals and nursing note reviewed.  Constitutional:      Appearance: Normal appearance.  HENT:     Head: Normocephalic and atraumatic.     Nose: Nose normal.     Mouth/Throat:     Mouth: Mucous membranes are moist.  Eyes:     Extraocular Movements: Extraocular movements intact.     Conjunctiva/sclera: Conjunctivae normal.  Cardiovascular:     Rate and Rhythm: Normal rate.  Pulmonary:     Effort: Pulmonary effort is normal.     Breath sounds: Normal breath sounds.  Abdominal:     General: Abdomen is flat.     Palpations: Abdomen is soft.     Tenderness: There is abdominal tenderness in the  left lower quadrant. There is no guarding. Negative signs include Murphy's sign and McBurney's sign.  Musculoskeletal:        General: No swelling. Normal range of motion.     Cervical back: Neck supple.  Skin:    General: Skin is warm and dry.  Neurological:     General: No focal deficit present.     Mental Status: He is alert.  Psychiatric:        Mood and Affect: Mood normal.     ED Results / Procedures / Treatments   EKG None  Procedures Procedures  Medications Ordered in the ED Medications - No data to display  Initial Impression and Plan  Patient here with several days of LLQ abdominal pain, likely diverticulitis vs constipation. Labs done in triage show CBC with mild leukocytosis, CMP lipase and UA are unremarkable. Will send for CT. Patient declines pain medications at this time.   ED Course       MDM Rules/Calculators/A&P Medical Decision Making Amount and/or Complexity of Data Reviewed Labs: ordered. Radiology: ordered.     Final Clinical Impression(s) / ED Diagnoses Final diagnoses:  None    Rx / DC Orders ED Discharge Orders     None

## 2023-03-16 NOTE — ED Triage Notes (Signed)
Patient here POV from Home.  Endorses LLQ ABD Pain that began 4-5 Days ago. Worsened since. Associated with Possible Constipation but OTC Interventions were effective to have a BM but not for pain.  No N/V/D. No Fevers. Mostly Constant.  NAD noted during Triage. A&Ox4. GCS 15. Ambulatory.

## 2023-03-17 ENCOUNTER — Emergency Department (HOSPITAL_BASED_OUTPATIENT_CLINIC_OR_DEPARTMENT_OTHER): Payer: Medicaid Other

## 2023-03-17 MED ORDER — IOHEXOL 300 MG/ML  SOLN
100.0000 mL | Freq: Once | INTRAMUSCULAR | Status: AC | PRN
  Administered 2023-03-17: 100 mL via INTRAVENOUS

## 2023-03-17 MED ORDER — DICYCLOMINE HCL 20 MG PO TABS: 20.0000 mg | ORAL_TABLET | Freq: Two times a day (BID) | ORAL | 0 refills | Status: AC
